# Patient Record
Sex: Male | Born: 1995 | Race: White | Hispanic: No | Marital: Single | Smoking: Current every day smoker
Health system: Southern US, Community
[De-identification: ages and names within clinical notes are randomized; demographics above are authoritative.]

## PROBLEM LIST (undated history)

## (undated) DIAGNOSIS — J45909 Unspecified asthma, uncomplicated: Secondary | ICD-10-CM

## (undated) HISTORY — PX: OTHER SURGICAL HISTORY: SHX169

---

## 2015-06-04 ENCOUNTER — Emergency Department (HOSPITAL_COMMUNITY): Payer: Self-pay

## 2015-06-04 ENCOUNTER — Encounter (HOSPITAL_COMMUNITY): Payer: Self-pay

## 2015-06-04 ENCOUNTER — Emergency Department (HOSPITAL_COMMUNITY)
Admission: EM | Admit: 2015-06-04 | Discharge: 2015-06-04 | Disposition: A | Discharge status: Home / Self Care | Payer: Self-pay | Attending: Emergency Medicine | Admitting: Emergency Medicine

## 2015-06-04 DIAGNOSIS — R112 Nausea with vomiting, unspecified: Secondary | ICD-10-CM | POA: Insufficient documentation

## 2015-06-04 DIAGNOSIS — R109 Unspecified abdominal pain: Secondary | ICD-10-CM | POA: Insufficient documentation

## 2015-06-04 DIAGNOSIS — F17200 Nicotine dependence, unspecified, uncomplicated: Secondary | ICD-10-CM | POA: Insufficient documentation

## 2015-06-04 DIAGNOSIS — Z880 Allergy status to penicillin: Secondary | ICD-10-CM | POA: Insufficient documentation

## 2015-06-04 DIAGNOSIS — J45909 Unspecified asthma, uncomplicated: Secondary | ICD-10-CM | POA: Insufficient documentation

## 2015-06-04 HISTORY — DX: Unspecified asthma, uncomplicated: J45.909

## 2015-06-04 LAB — COMPREHENSIVE METABOLIC PANEL
ALK PHOS: 43 U/L (ref 38–126)
ALT: 24 U/L (ref 17–63)
ANION GAP: 16 — AB (ref 5–15)
AST: 32 U/L (ref 15–41)
Albumin: 4.8 g/dL (ref 3.5–5.0)
BILIRUBIN TOTAL: 1.1 mg/dL (ref 0.3–1.2)
BUN: 14 mg/dL (ref 6–20)
CALCIUM: 10.2 mg/dL (ref 8.9–10.3)
CO2: 21 mmol/L — ABNORMAL LOW (ref 22–32)
Chloride: 102 mmol/L (ref 101–111)
Creatinine, Ser: 1.02 mg/dL (ref 0.61–1.24)
GFR calc non Af Amer: >60 mL/min (ref >60)
GLUCOSE: 112 mg/dL — AB (ref 65–99)
Potassium: 3.4 mmol/L — ABNORMAL LOW (ref 3.5–5.1)
Sodium: 139 mmol/L (ref 135–145)
TOTAL PROTEIN: 7.2 g/dL (ref 6.5–8.1)

## 2015-06-04 LAB — ETHANOL

## 2015-06-04 LAB — CBC
HCT: 42.8 % (ref 39.0–52.0)
HEMOGLOBIN: 15.3 g/dL (ref 13.0–17.0)
MCH: 32.6 pg (ref 26.0–34.0)
MCHC: 35.7 g/dL (ref 30.0–36.0)
MCV: 91.3 fL (ref 78.0–100.0)
PLATELETS: 229 10*3/uL (ref 150–400)
RBC: 4.69 MIL/uL (ref 4.22–5.81)
RDW: 13.1 % (ref 11.5–15.5)
WBC: 12.4 10*3/uL — ABNORMAL HIGH (ref 4.0–10.5)

## 2015-06-04 LAB — LIPASE, BLOOD: Lipase: 28 U/L (ref 11–51)

## 2015-06-04 MED ORDER — ONDANSETRON 4 MG PO TBDP
ORAL_TABLET | ORAL | Status: AC
  Filled 2015-06-04: qty 1

## 2015-06-04 MED ORDER — OXYCODONE-ACETAMINOPHEN 5-325 MG PO TABS
2.0000 | ORAL_TABLET | Freq: Once | ORAL | Status: AC
  Administered 2015-06-04: 2 via ORAL
  Filled 2015-06-04: qty 2

## 2015-06-04 MED ORDER — METOCLOPRAMIDE HCL 10 MG PO TABS: 10.0000 mg | ORAL_TABLET | Freq: Four times a day (QID) | ORAL | Status: AC | PRN

## 2015-06-04 MED ORDER — METOCLOPRAMIDE HCL 5 MG/ML IJ SOLN
10.0000 mg | Freq: Once | INTRAMUSCULAR | Status: AC
  Administered 2015-06-04: 10 mg via INTRAVENOUS
  Filled 2015-06-04: qty 2

## 2015-06-04 MED ORDER — ONDANSETRON 4 MG PO TBDP
4.0000 mg | ORAL_TABLET | Freq: Once | ORAL | Status: AC | PRN
  Administered 2015-06-04: 4 mg via ORAL

## 2015-06-04 MED ORDER — IOHEXOL 300 MG/ML  SOLN
100.0000 mL | Freq: Once | INTRAMUSCULAR | Status: AC | PRN
  Administered 2015-06-04: 100 mL via INTRAVENOUS

## 2015-06-04 MED ORDER — SODIUM CHLORIDE 0.9 % IV BOLUS (SEPSIS)
2000.0000 mL | Freq: Once | INTRAVENOUS | Status: AC
  Administered 2015-06-04: 2000 mL via INTRAVENOUS

## 2015-06-04 MED ORDER — HYDROMORPHONE HCL 1 MG/ML IJ SOLN
1.0000 mg | Freq: Once | INTRAMUSCULAR | Status: AC
  Administered 2015-06-04: 1 mg via INTRAVENOUS
  Filled 2015-06-04: qty 1

## 2015-06-04 MED ORDER — POLYETHYLENE GLYCOL 3350 17 G PO PACK: 17.0000 g | PACK | Freq: Every day | ORAL | Status: AC

## 2015-06-04 NOTE — ED Notes (Signed)
Rt. Upper quadrant abdominal pain, with n/v for 4 day s. Pt. Is constipated.   Pt.  [facility]. But he has not felt any better  Skin is pale warm and dry.

## 2015-06-04 NOTE — ED Provider Notes (Signed)
CSN: [id number]     Arrival date & time 06/04/15  1025 History   First MD Initiated Contact with Patient 06/04/15 1159     Chief Complaint  Patient presents with  . Abdominal Pain     (Consider location/radiation/quality/duration/timing/severity/associated sxs/prior Treatment) Patient is a 20 y.o. male presenting with abdominal pain.  Abdominal Pain Pain location:  RLQ Pain quality: aching and sharp   Pain radiates to:  Does not radiate Pain severity:  Severe Duration:  4 days Timing:  Constant Progression:  Waxing and waning Chronicity:  New Context: not alcohol use, not eating and not medication withdrawal   Relieved by:  None tried Worsened by:  Nothing tried Associated symptoms: nausea and vomiting   Associated symptoms: no chest pain, no cough, no dysuria, no fatigue, no fever and no shortness of breath     Past Medical History  Diagnosis Date  . Asthma    Past Surgical History  Procedure Laterality Date  . 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 No pertinent family history. Social History  Substance Use Topics  . Smoking status: Current Every Day Smoker  . Smokeless tobacco: None  . Alcohol Use: No    Review of Systems  Constitutional: Negative for fever and fatigue.  Eyes: Negative for pain.  Respiratory: Negative for cough, shortness of breath and stridor.   Cardiovascular: Negative for chest pain.  Gastrointestinal: Positive for nausea, vomiting and abdominal pain.  Endocrine: Negative for polydipsia and polyuria.  Genitourinary: Negative for dysuria and flank pain.  All other systems reviewed and are negative.     Allergies  Ibuprofen and Penicillins  Home Medications   Prior to Admission medications   Medication Sig Start Date End Date Taking? Authorizing Provider  acetaminophen (TYLENOL) 500 MG tablet Take 1,000 mg by mouth every 6 (six) hours as needed for moderate pain.   Yes Historical Provider, MD  hydrOXYzine  (VISTARIL) 50 MG capsule Take 50 mg by mouth 3 (three) times daily as needed. 06/03/15 06/13/15 Yes Historical Provider, MD  hyoscyamine (ANASPAZ) 0.125 MG TBDP disintergrating tablet Take 1 tablet by mouth 3 (three) times daily as needed. 06/03/15  Yes Historical Provider, MD  metoCLOPramide (REGLAN) 10 MG tablet Take 1 tablet (10 mg total) by mouth every 6 (six) hours as needed for nausea (nausea/headache). 06/04/15   Marily Memos, MD  polyethylene glycol (MIRALAX / GLYCOLAX) packet Take 17 g by mouth daily. 06/04/15   Barbara Cower Jailynn Lavalais, MD   BP 103/57 mmHg  Pulse 84  Temp(Src) 99.1 F (37.3 C) (Oral)  Resp 17  Ht  (1.626 m)  Wt 142 lb (64.411 kg)  BMI 24.36 kg/m2  SpO2 96% Physical Exam  Constitutional: He is oriented to person, place, and time. He appears well-developed and well-nourished.  HENT:  Head: Normocephalic and atraumatic.  Neck: Normal range of motion.  Cardiovascular: Normal rate.   Pulmonary/Chest: Effort normal. No respiratory distress. He has no wheezes.  Abdominal: He exhibits no distension. There is tenderness. There is no rebound and no guarding.  Musculoskeletal: Normal range of motion.  Neurological: He is alert and oriented to person, place, and time. No cranial nerve deficit. Coordination normal.  Skin: Skin is warm and dry.  Nursing note and vitals reviewed.   ED Course  Procedures (including critical care time) Labs Review Labs Reviewed  COMPREHENSIVE METABOLIC PANEL - Abnormal; Notable for the following:    Potassium 3.4 (*)    CO2 21 (*)  Glucose, Bld 112 (*)    Anion gap 16 (*)    All other components within normal limits  CBC - Abnormal; Notable for the following:    WBC 12.4 (*)    All other components within normal limits  LIPASE, BLOOD  ETHANOL  URINALYSIS, ROUTINE W REFLEX MICROSCOPIC (NOT AT [facility])  URINE RAPID DRUG SCREEN, HOSP PERFORMED    Imaging Review Ct Abdomen Pelvis W Contrast  06/04/2015  CLINICAL DATA:  Right-sided abdominal  pain with nausea and vomiting for 4 days. EXAM: CT ABDOMEN AND PELVIS WITH CONTRAST TECHNIQUE: Multidetector CT imaging of the abdomen and pelvis was performed using the standard protocol following bolus administration of intravenous contrast. CONTRAST:  100mL OMNIPAQUE IOHEXOL 300 MG/ML  SOLN COMPARISON:  None. FINDINGS: Normal lung bases. No free air or free fluid. The liver, gallbladder, portal vein, spleen, adrenal glands, pancreas, and kidneys are normal. The abdominal aorta is normal in caliber with no aneurysm. No significant ventral wall defects. Evaluation of bowel is limited due to lack of oral contrast. The stomach is normal. No small bowel abnormalities are identified. A few small bowel loops demonstrate mild prominence of wall, likely due to is lack of distention. The colon is normal in appearance. Visualized portions of the appendix are normal as well. No adenopathy or mass in the pelvis. The prostate, seminal vesicles, and bladder are normal. IMPRESSION: There is a tiny amount of free fluid in the right inferior pericolic gutter, best seen on coronal image 25. This is a nonspecific finding. No other acute abnormalities identified. Electronically Signed   By: Gerome Samavid  Williams III M.D   On: 06/04/2015 14:00   I have personally reviewed and evaluated these images and lab results as part of my medical decision-making.   EKG Interpretation None      MDM   Final diagnoses:  Abdominal pain, unspecified abdominal location    20 yo M w/ abdominal pain, RLQ. Likely related to constipation. However with white count and an right lower quadrant tenderness and evaluation for appendicitis was performed and this was negative. Patient's symptoms improved with fluids, pain meds and reglan. CT without e/o appendicitis/cholecystitis/colitis/enteritis or other acute causes of his pain. Repeat abdominal exam improved from previous. Will FU w/ PCP on Monday.   I have personally and contemperaneously reviewed  labs and imaging and used in my decision making as above.   A medical screening exam was performed and I feel the patient has had an appropriate workup for their chief complaint at this time and likelihood of emergent condition existing is low. Their vital signs are stable. They have been counseled on decision, discharge, follow up and which symptoms necessitate immediate return to the emergency department.  They verbally stated understanding and agreement with plan and discharged in stable condition.    Marily MemosJason Fountain Derusha, MD 06/04/15 [phone]

## 2016-07-23 IMAGING — CT CT ABD-PELV W/ CM
2 of 4 series · 12 of 46 positions shown, 14 images · IV contrast (Iodine)
Comparison: None.

CLINICAL DATA: Right-sided abdominal pain with nausea and vomiting
for 4 days.

EXAM:
CT ABDOMEN AND PELVIS WITH CONTRAST
TECHNIQUE: Multidetector CT imaging of the abdomen and pelvis was performed
using the standard protocol following bolus administration of
intravenous contrast.
CONTRAST:  100mL OMNIPAQUE IOHEXOL 300 MG/ML  SOLN

[Series 201: routine, idose (2) · axial · 0.77mm/px · z∈[-713,-383]mm · 9 of 80 slices shown, 11 images]
[im 7/80  soft-tissue]
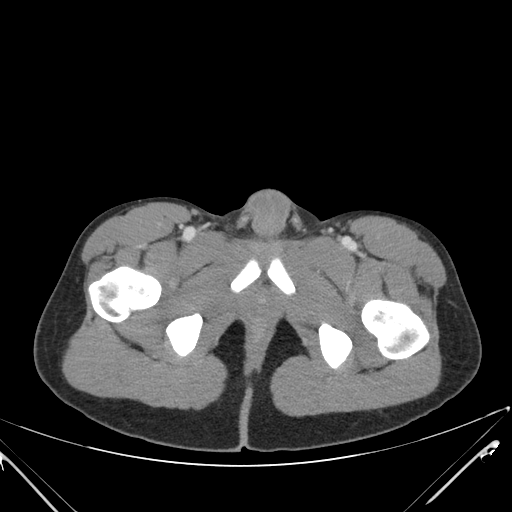
[im 7/80  bone]
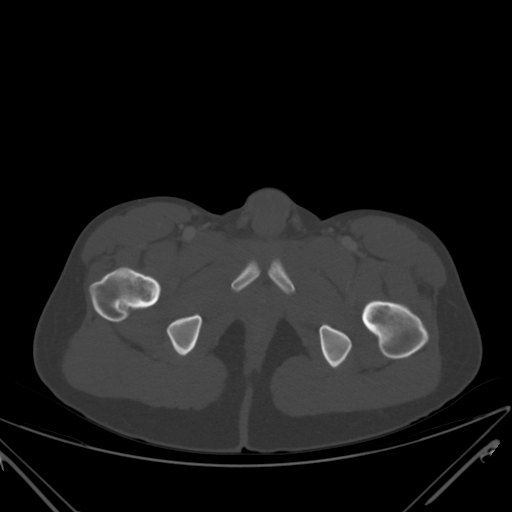
[im 16/80  soft-tissue]
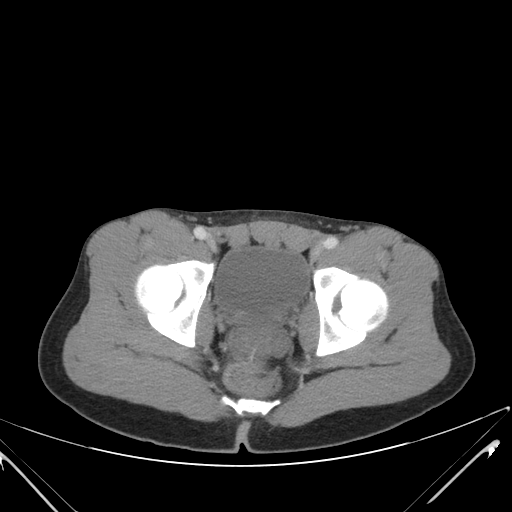
[im 22/80  soft-tissue]
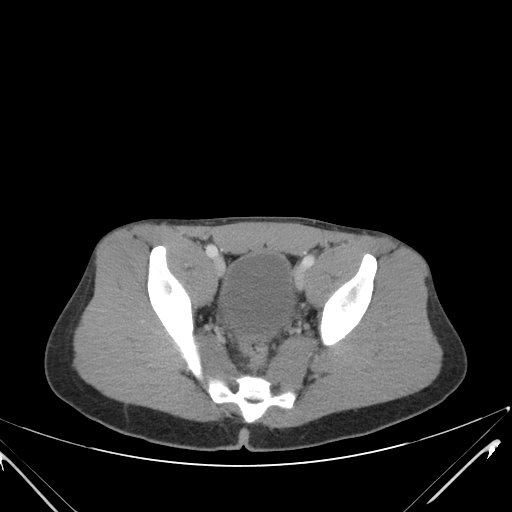
[im 31/80  soft-tissue]
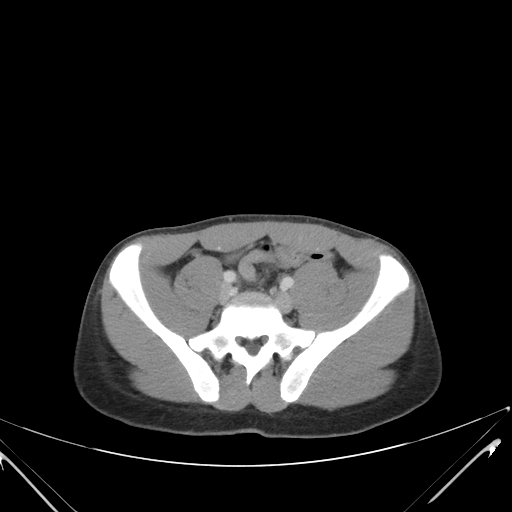
[im 40/80  soft-tissue]
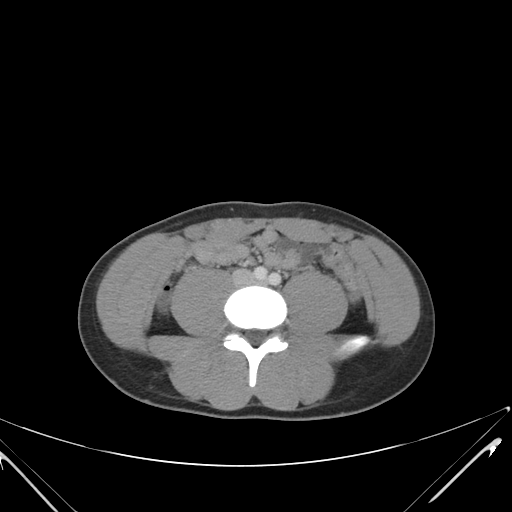
[im 49/80  soft-tissue]
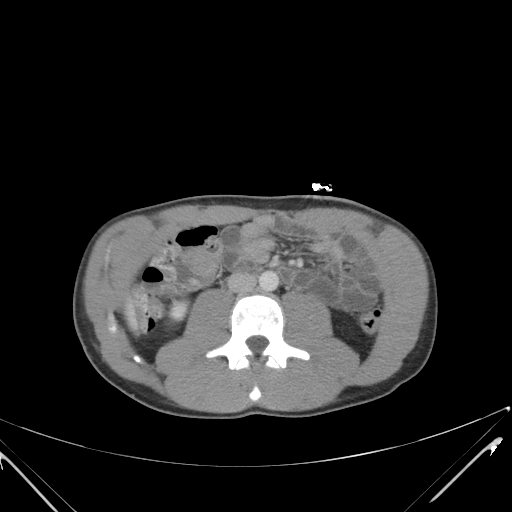
[im 58/80  soft-tissue]
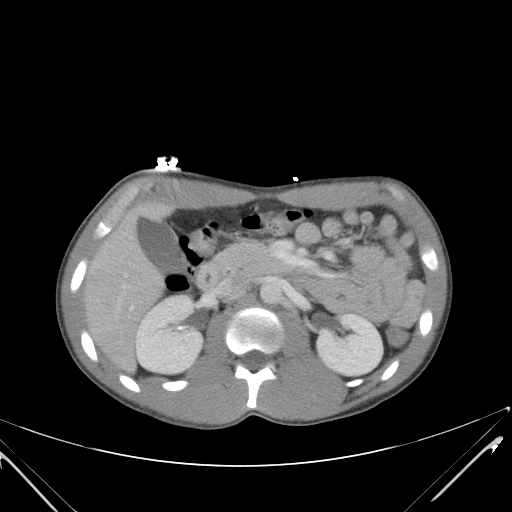
[im 64/80  soft-tissue]
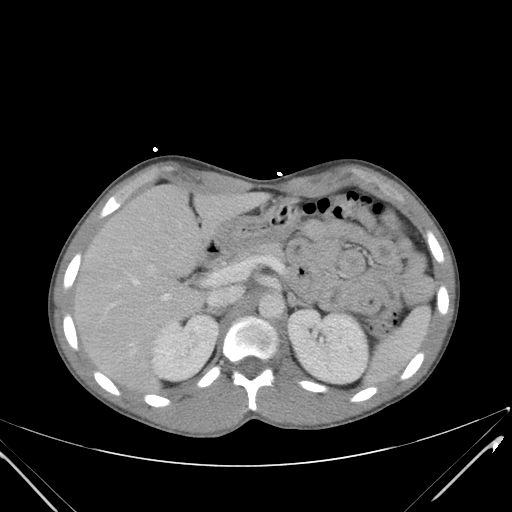
[im 73/80  soft-tissue]
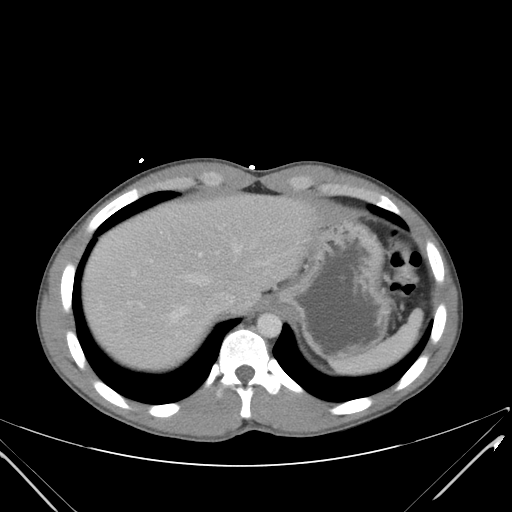
[im 73/80  bone]
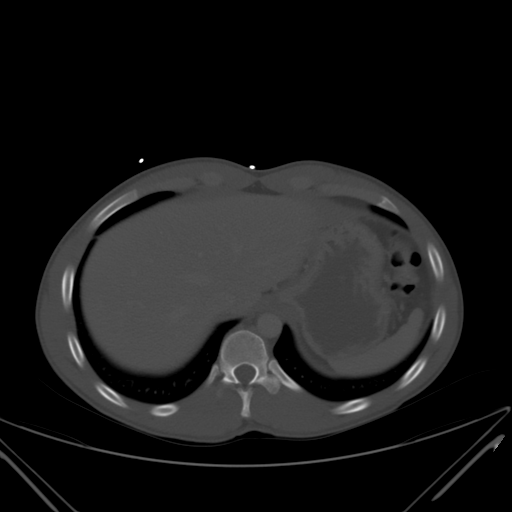

[Series 203: coronals, idose (2) · coronal · 0.45mm/px · 3 of 90 slices shown]
[im 30/90  soft-tissue]
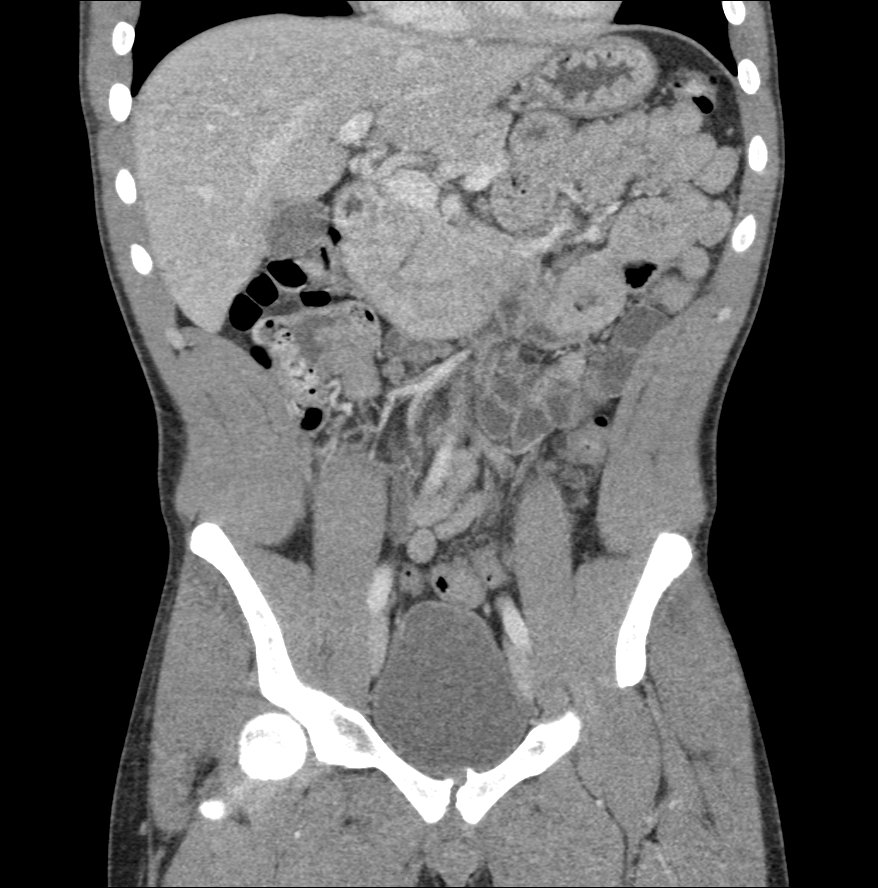
[im 40/90  soft-tissue]
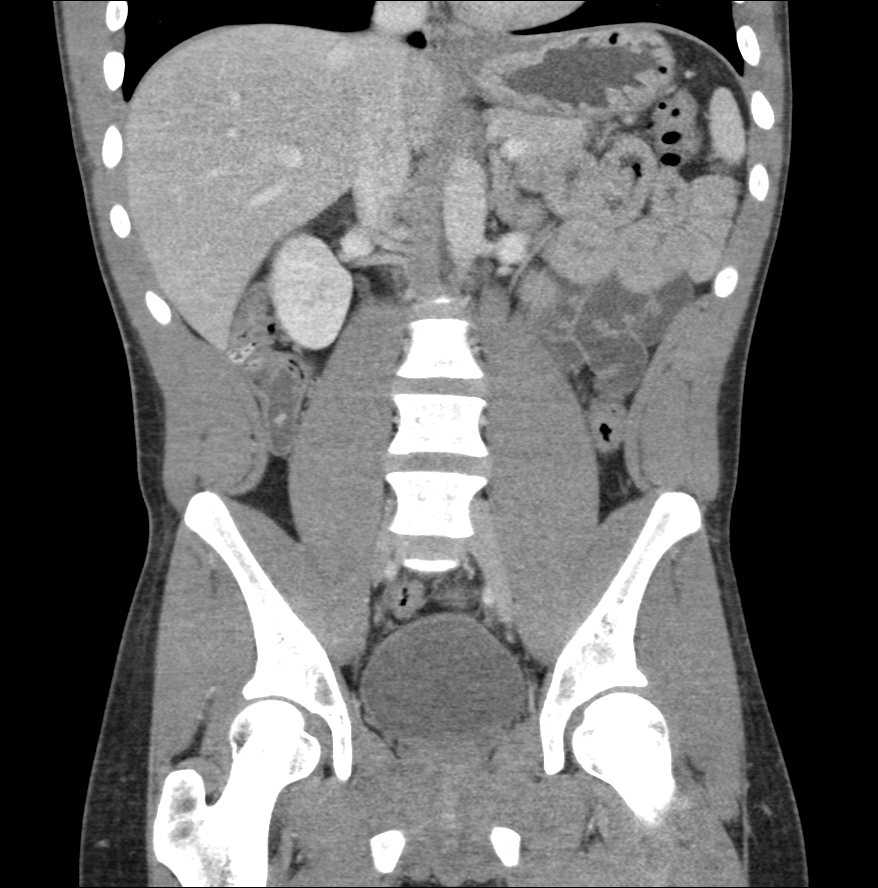
[im 50/90  soft-tissue]
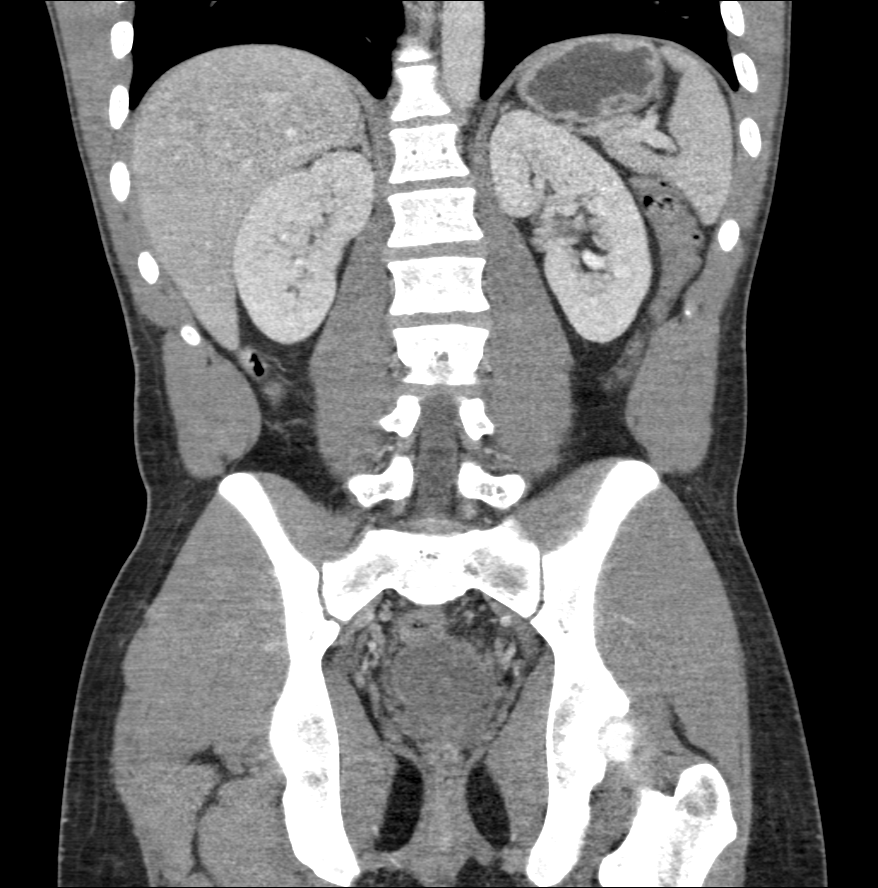

[12 of 46 positions shown; findings below may reference images not displayed]

FINDINGS: Normal lung bases.

No free air or free fluid. The liver, gallbladder, portal vein,
spleen, adrenal glands, pancreas, and kidneys are normal. The
abdominal aorta is normal in caliber with no aneurysm. No
significant ventral wall defects. Evaluation of bowel is limited due
to lack of oral contrast. The stomach is normal. No small bowel
abnormalities are identified. A few small bowel loops demonstrate
mild prominence of wall, likely due to is lack of distention. The
colon is normal in appearance. Visualized portions of the appendix
are normal as well.

No adenopathy or mass in the pelvis. The prostate, seminal vesicles,
and bladder are normal.
IMPRESSION: There is a tiny amount of free fluid in the right inferior pericolic
gutter, best seen on coronal image 25. This is a nonspecific
finding. No other acute abnormalities identified.
